# Patient Record
Sex: Male | Born: 1970 | Race: Black or African American | Hispanic: No | Marital: Single | Smoking: Current some day smoker
Health system: Southern US, Community
[De-identification: ages and names within clinical notes are randomized; demographics above are authoritative.]

## PROBLEM LIST (undated history)

## (undated) DIAGNOSIS — IMO0002 Reserved for concepts with insufficient information to code with codable children: Secondary | ICD-10-CM

## (undated) DIAGNOSIS — M329 Systemic lupus erythematosus, unspecified: Secondary | ICD-10-CM

## (undated) HISTORY — PX: HERNIA REPAIR: SHX51

## (undated) HISTORY — PX: HAND SURGERY: SHX662

---

## 2009-12-13 ENCOUNTER — Ambulatory Visit: Payer: Self-pay

## 2012-02-29 IMAGING — CR DG WRIST 2V*R*
1 series · 2 of 2 positions shown · non-contrast
Comparison: None

REASON FOR EXAM: carpel tunnel tendonitis problem with both hands
[id number]
COMMENTS:

PROCEDURE:     DXR - DXR WRIST RIGHT AP AND LATERAL  - December 13, 2009  [DATE]
RESULT:     History: Pain

[Series 1: view not recorded · 0.17mm/px · 2 of 2 slices shown]
[im 1/2]
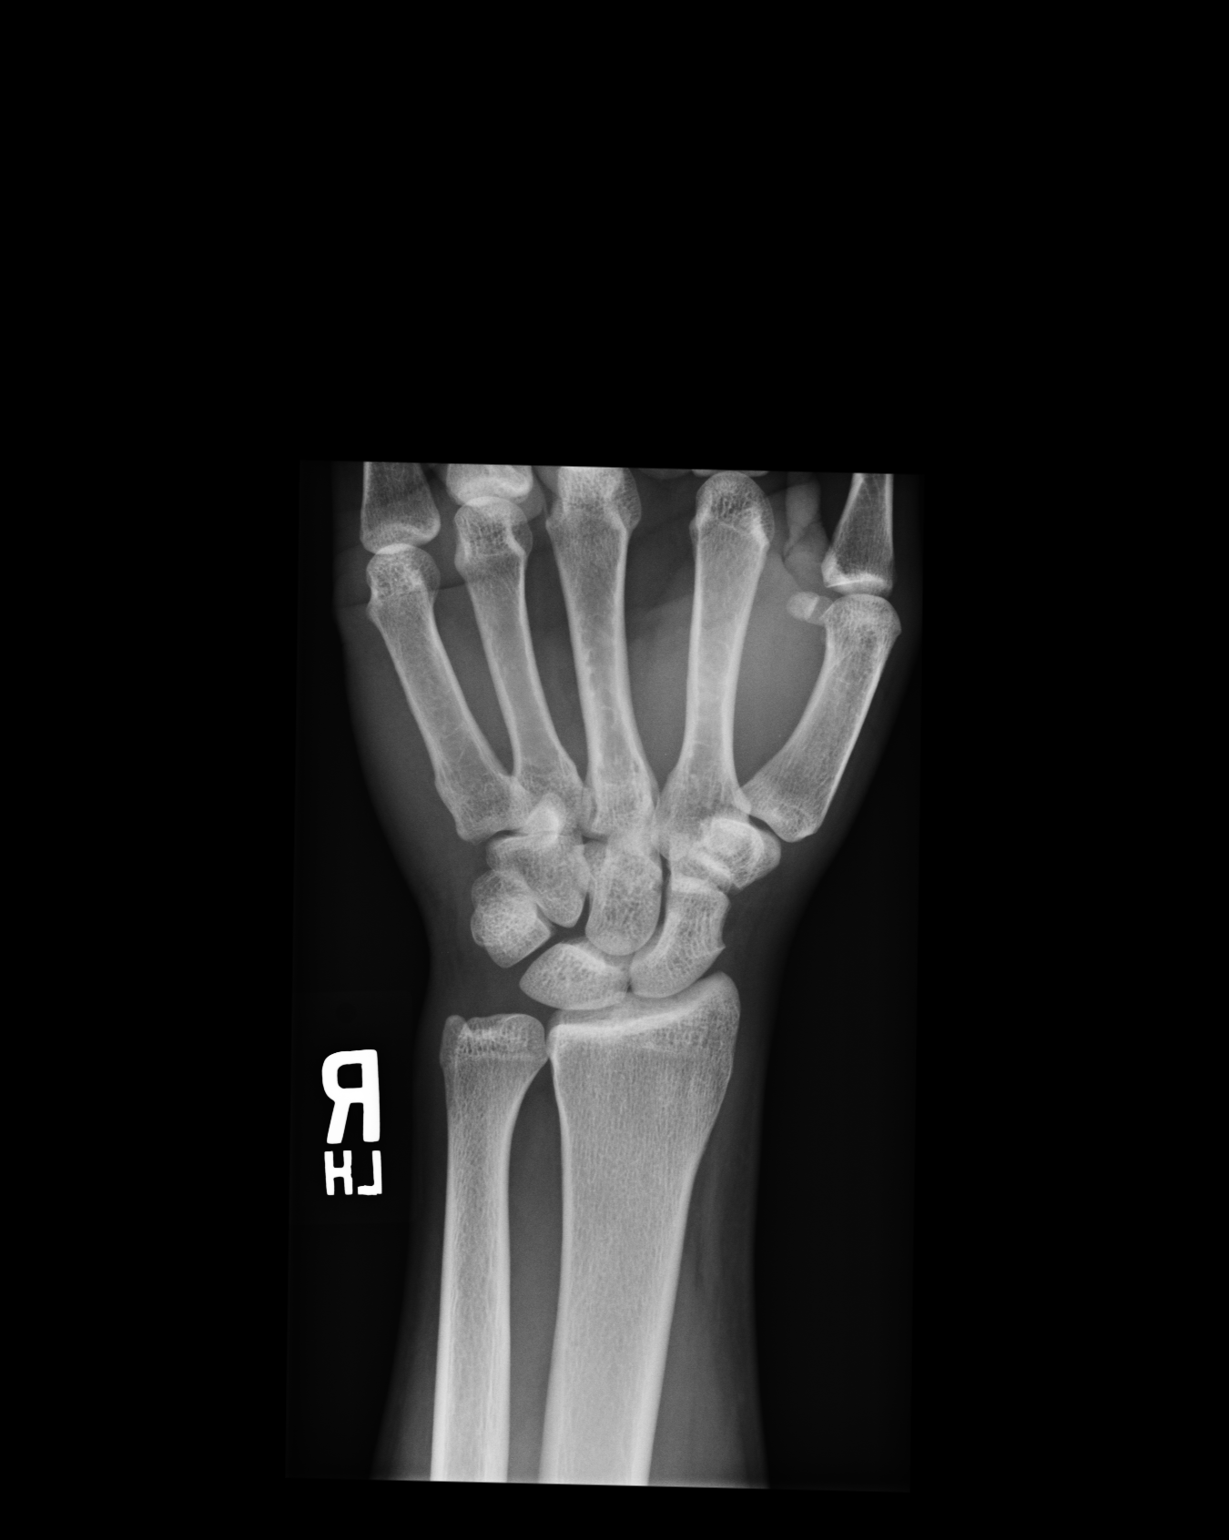
[im 2/2]
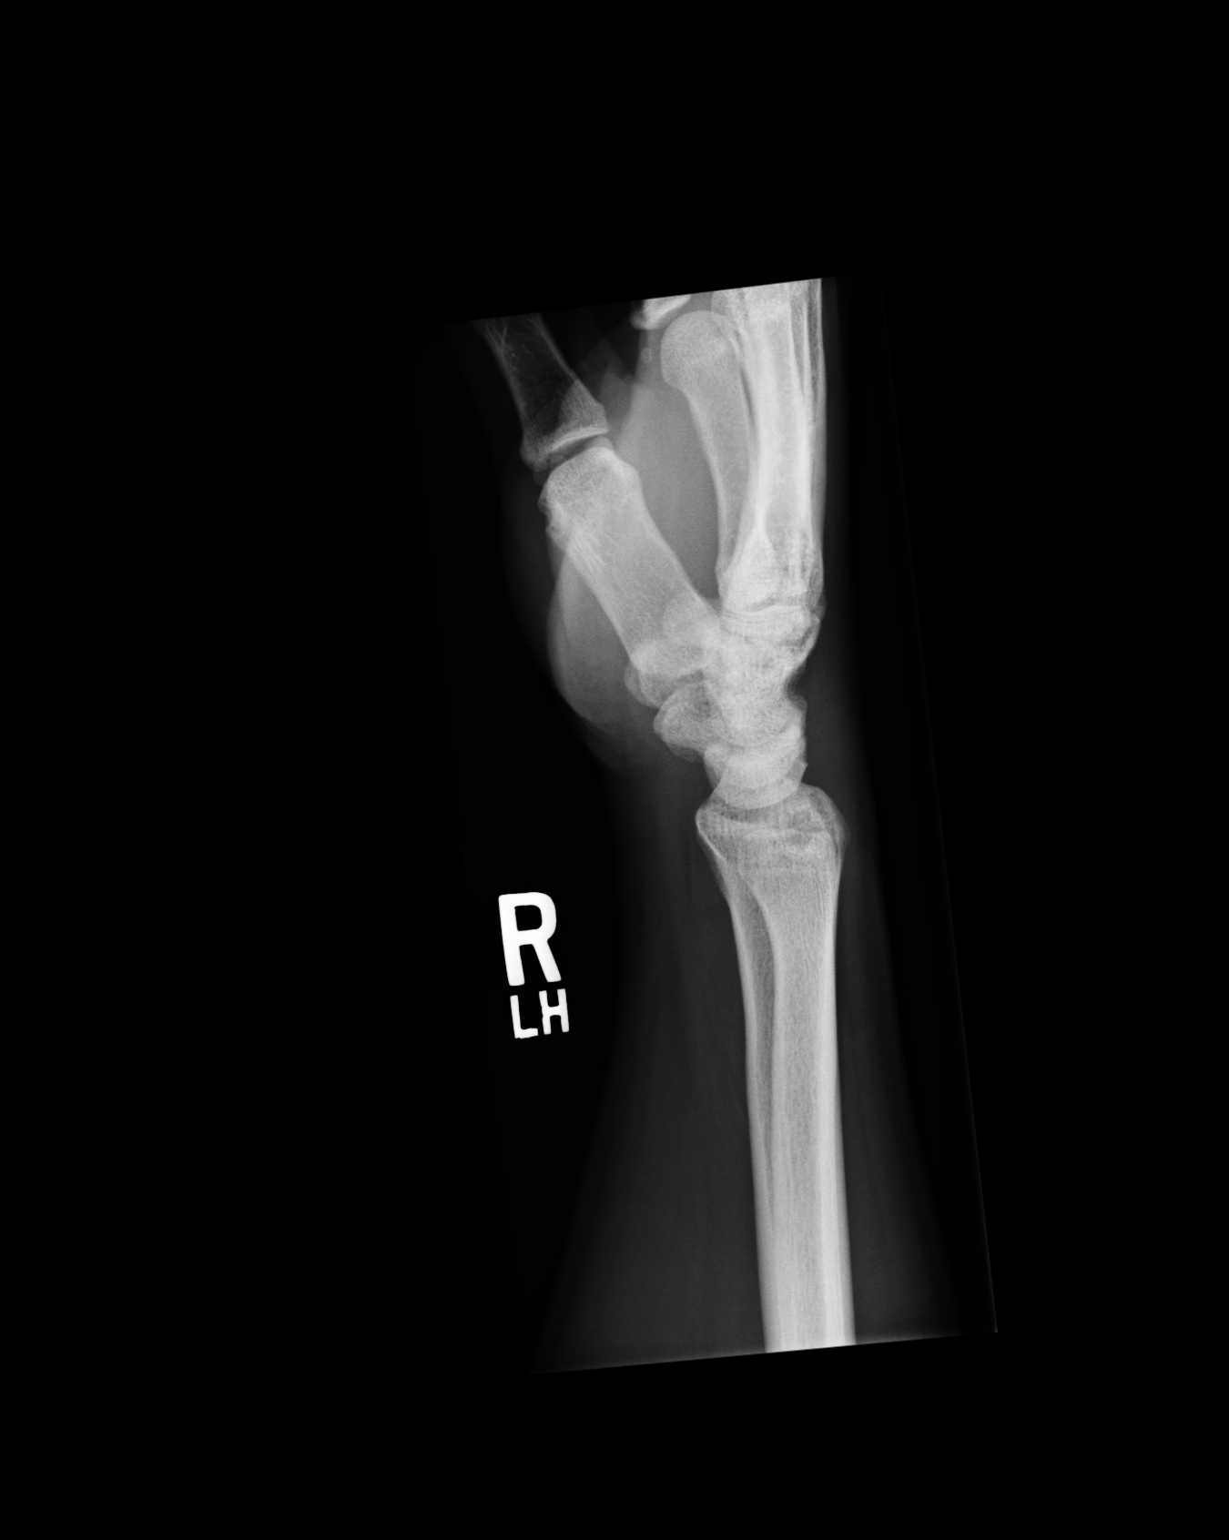

[2 of 2 positions shown; findings below may reference images not displayed]

FINDINGS: AP and lateral views of the right wrist demonstrates no acute fracture or
dislocation. The joint spaces are maintained. The soft tissues appear normal.
IMPRESSION: No acute osseous abnormality of the right wrist.

If there is clinical concern regarding tendinitis or carpal tunnel
pathology, further assessment with MRI is recommended.

## 2012-02-29 IMAGING — CR LEFT WRIST - 2 VIEW
1 series · 2 of 2 positions shown · non-contrast
Comparison: None

REASON FOR EXAM: carpel tunnel tendonitis problem with both hands
[id number]
COMMENTS:

PROCEDURE:     DXR - DXR WRIST LEFT AP AND LATERAL  - December 13, 2009  [DATE]
RESULT:     History: Pain

[Series 1: view not recorded · 0.17mm/px · 2 of 2 slices shown]
[im 1/2]
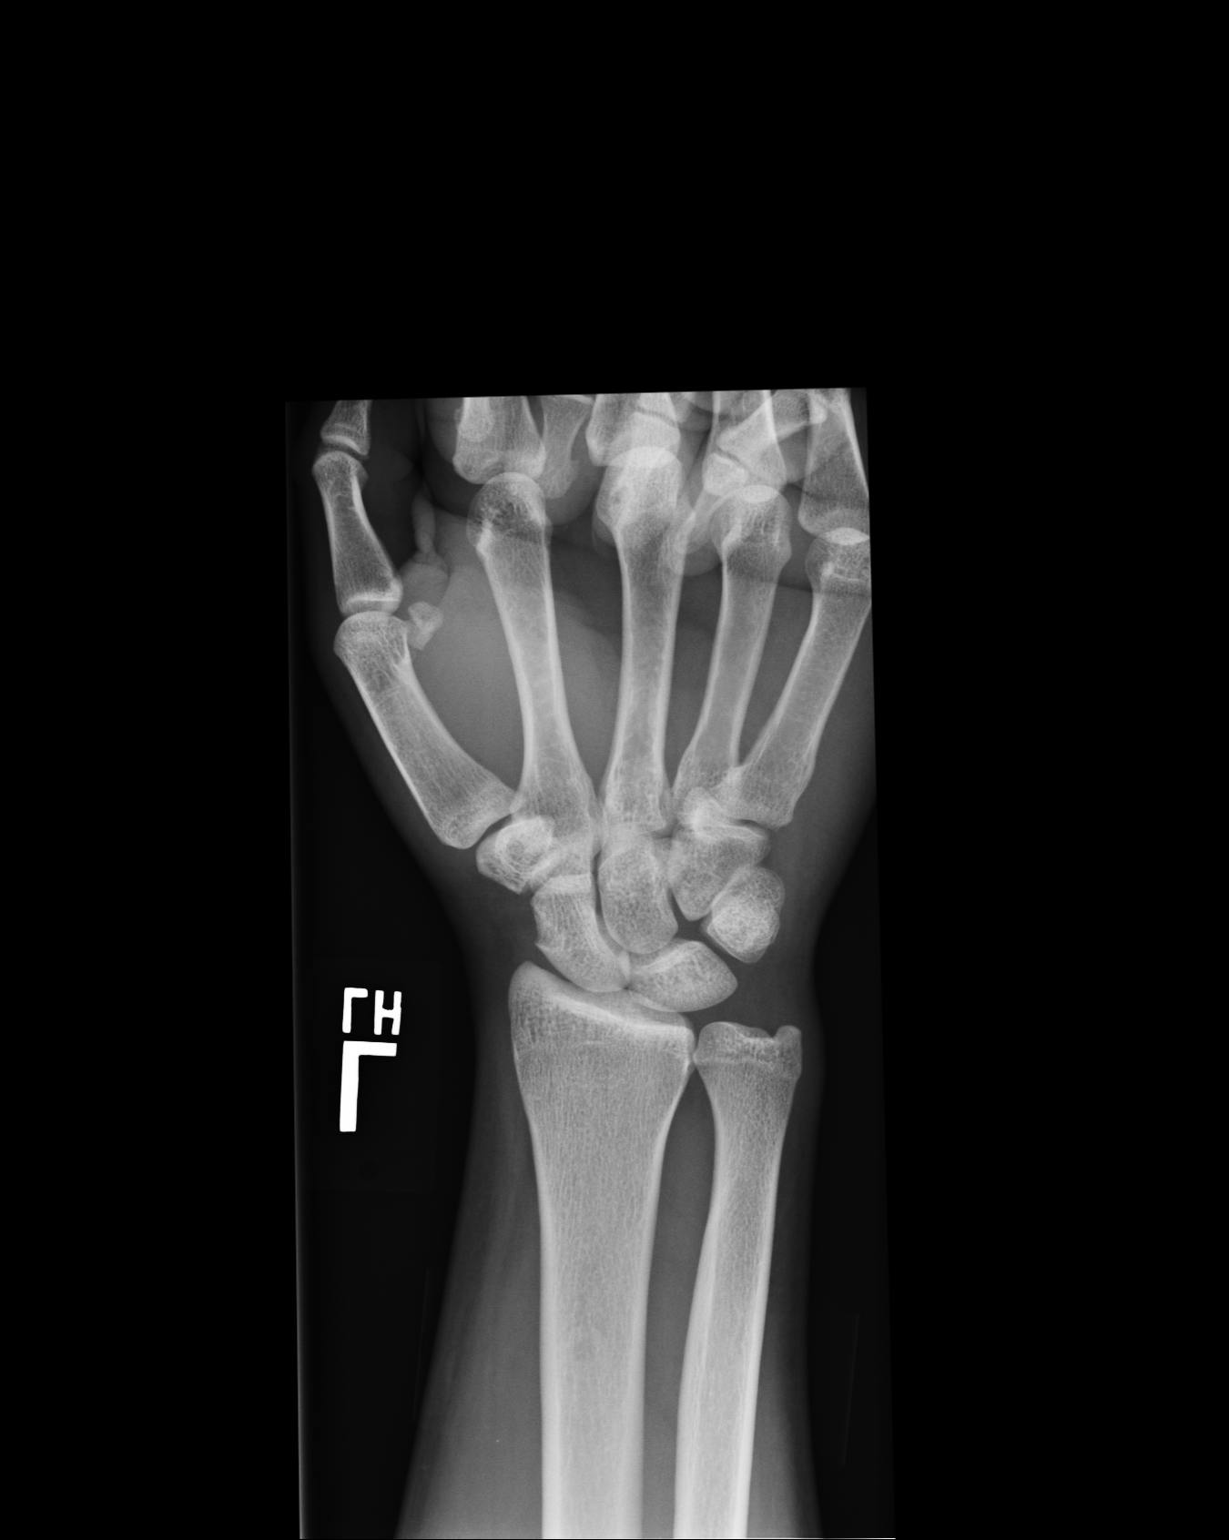
[im 2/2]
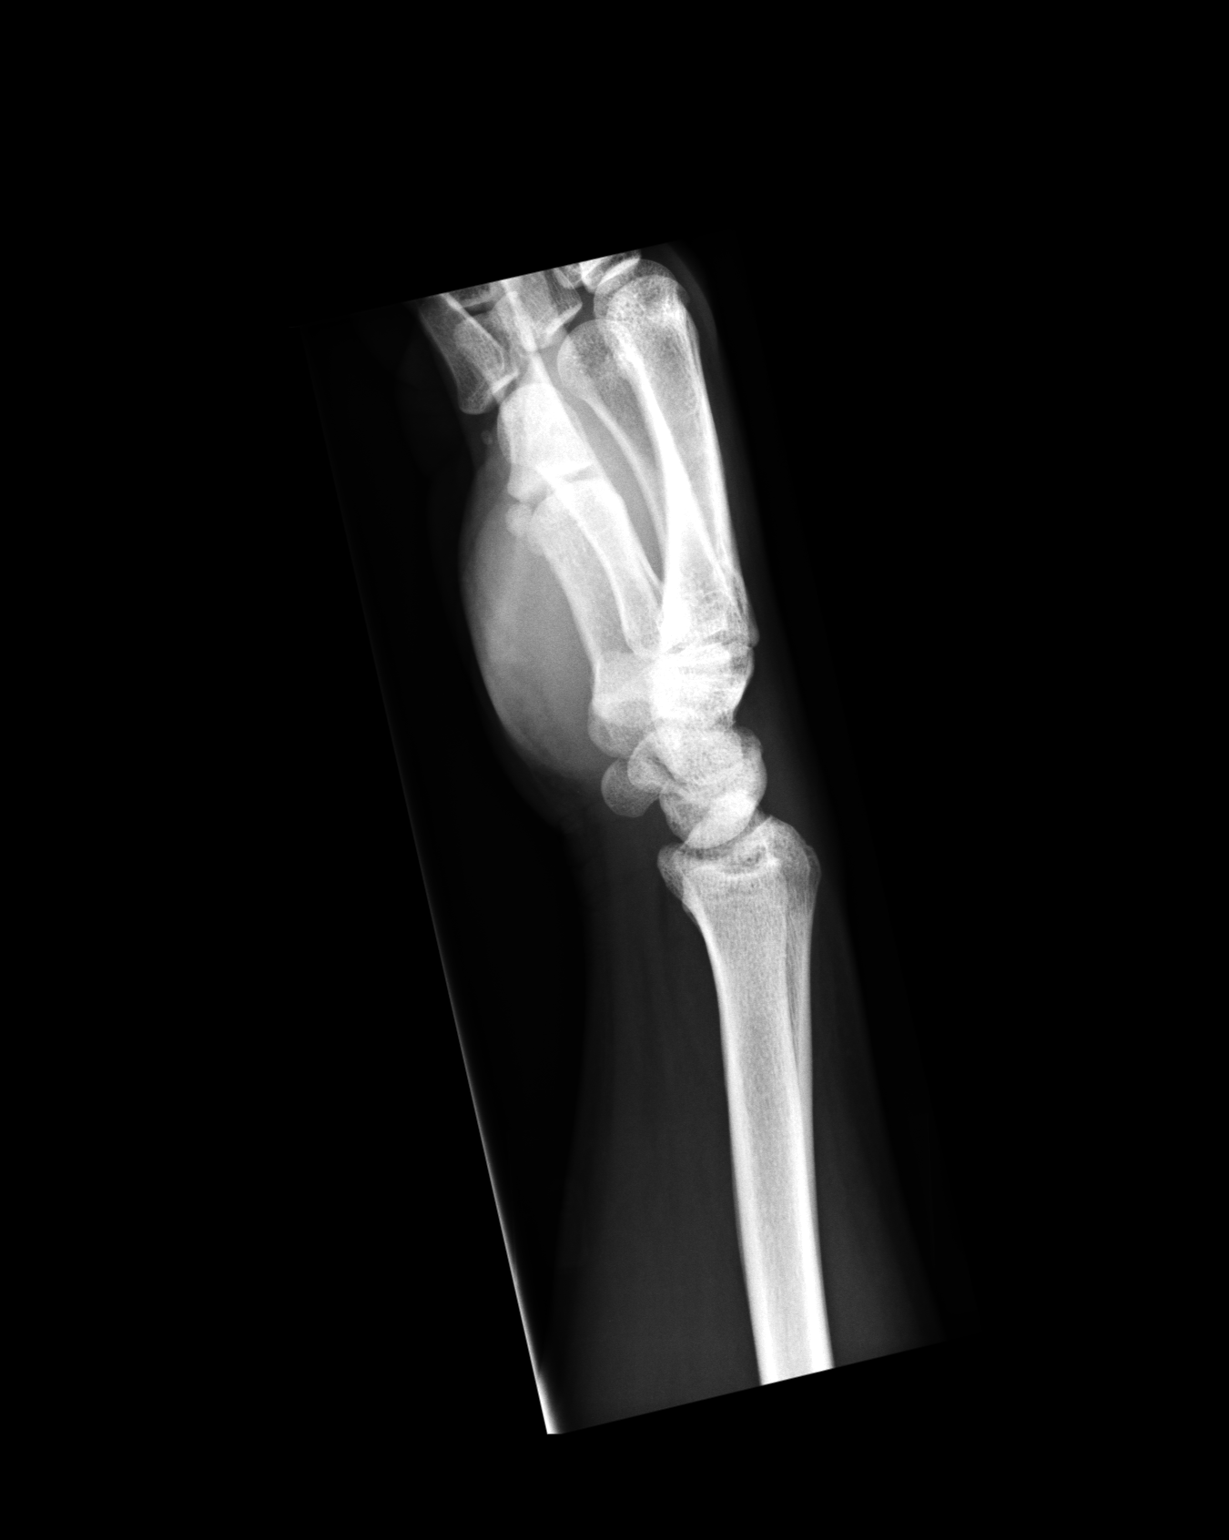

[2 of 2 positions shown; findings below may reference images not displayed]

FINDINGS: AP and lateral views of the left wrist demonstrates no acute fracture or
dislocation. The joint spaces are maintained. The soft tissues appear normal.
IMPRESSION: No acute osseous abnormality of the left wrist.

If there is clinical concern regarding tendinitis or carpal tunnel
pathology, further assessment with MRI is recommended.

## 2014-06-26 ENCOUNTER — Encounter: Payer: Self-pay | Admitting: Emergency Medicine

## 2014-06-26 ENCOUNTER — Emergency Department
Admission: EM | Admit: 2014-06-26 | Discharge: 2014-06-26 | Disposition: A | Discharge status: Home / Self Care | Payer: Self-pay | Attending: Emergency Medicine | Admitting: Emergency Medicine

## 2014-06-26 DIAGNOSIS — Z720 Tobacco use: Secondary | ICD-10-CM | POA: Insufficient documentation

## 2014-06-26 DIAGNOSIS — Z79899 Other long term (current) drug therapy: Secondary | ICD-10-CM | POA: Insufficient documentation

## 2014-06-26 DIAGNOSIS — K922 Gastrointestinal hemorrhage, unspecified: Secondary | ICD-10-CM | POA: Insufficient documentation

## 2014-06-26 HISTORY — DX: Systemic lupus erythematosus, unspecified: M32.9

## 2014-06-26 HISTORY — DX: Reserved for concepts with insufficient information to code with codable children: IMO0002

## 2014-06-26 LAB — CBC WITH DIFFERENTIAL/PLATELET
BASOS PCT: 1 %
Basophils Absolute: 0.1 10*3/uL (ref 0–0.1)
EOS ABS: 0.4 10*3/uL (ref 0–0.7)
Eosinophils Relative: 4 %
HCT: 48.3 % (ref 40.0–52.0)
Hemoglobin: 15.3 g/dL (ref 13.0–18.0)
LYMPHS PCT: 19 %
Lymphs Abs: 1.9 10*3/uL (ref 1.0–3.6)
MCH: 25.6 pg — AB (ref 26.0–34.0)
MCHC: 31.8 g/dL — ABNORMAL LOW (ref 32.0–36.0)
MCV: 80.7 fL (ref 80.0–100.0)
Monocytes Absolute: 0.5 10*3/uL (ref 0.2–1.0)
Monocytes Relative: 5 %
Neutro Abs: 7.4 10*3/uL — ABNORMAL HIGH (ref 1.4–6.5)
Neutrophils Relative %: 71 %
Platelets: 344 10*3/uL (ref 150–440)
RBC: 5.98 MIL/uL — AB (ref 4.40–5.90)
RDW: 15.9 % — ABNORMAL HIGH (ref 11.5–14.5)
WBC: 10.2 10*3/uL (ref 3.8–10.6)

## 2014-06-26 LAB — COMPREHENSIVE METABOLIC PANEL
ALBUMIN: 4 g/dL (ref 3.5–5.0)
ALK PHOS: 94 U/L (ref 38–126)
ALT: 24 U/L (ref 17–63)
ANION GAP: 9 (ref 5–15)
AST: 27 U/L (ref 15–41)
BUN: 12 mg/dL (ref 6–20)
CALCIUM: 9.2 mg/dL (ref 8.9–10.3)
CO2: 26 mmol/L (ref 22–32)
CREATININE: 1.2 mg/dL (ref 0.61–1.24)
Chloride: 100 mmol/L — ABNORMAL LOW (ref 101–111)
GFR calc non Af Amer: >60 mL/min (ref >60)
Glucose, Bld: 81 mg/dL (ref 65–99)
POTASSIUM: 3.9 mmol/L (ref 3.5–5.1)
Sodium: 135 mmol/L (ref 135–145)
TOTAL PROTEIN: 8.4 g/dL — AB (ref 6.5–8.1)
Total Bilirubin: 0.7 mg/dL (ref 0.3–1.2)

## 2014-06-26 LAB — TROPONIN I

## 2014-06-26 LAB — LIPASE, BLOOD: Lipase: 29 U/L (ref 22–51)

## 2014-06-26 MED ORDER — CALCIUM CARBONATE ANTACID 500 MG PO CHEW
1.0000 | CHEWABLE_TABLET | Freq: Once | ORAL | Status: AC
  Administered 2014-06-26: 200 mg via ORAL
  Filled 2014-06-26: qty 1

## 2014-06-26 MED ORDER — GI COCKTAIL ~~LOC~~
30.0000 mL | Freq: Once | ORAL | Status: AC
  Administered 2014-06-26: 30 mL via ORAL

## 2014-06-26 MED ORDER — ONDANSETRON 4 MG PO TBDP: 4.0000 mg | ORAL_TABLET | Freq: Once | ORAL | Status: AC

## 2014-06-26 MED ORDER — ONDANSETRON 4 MG PO TBDP: 4.0000 mg | ORAL_TABLET | Freq: Four times a day (QID) | ORAL | Status: AC | PRN

## 2014-06-26 MED ORDER — FAMOTIDINE 20 MG PO TABS: 20.0000 mg | ORAL_TABLET | Freq: Two times a day (BID) | ORAL | Status: DC

## 2014-06-26 MED ORDER — GI COCKTAIL ~~LOC~~
ORAL | Status: AC
  Administered 2014-06-26: 30 mL via ORAL
  Filled 2014-06-26: qty 30

## 2014-06-26 MED ORDER — FAMOTIDINE 20 MG PO TABS
40.0000 mg | ORAL_TABLET | Freq: Once | ORAL | Status: AC
  Administered 2014-06-26: 40 mg via ORAL
  Filled 2014-06-26: qty 2

## 2014-06-26 MED ORDER — ONDANSETRON HCL 4 MG PO TABS
ORAL_TABLET | ORAL | Status: AC
  Administered 2014-06-26: 4 mg
  Filled 2014-06-26: qty 1

## 2014-06-26 NOTE — ED Notes (Signed)
Patient also c/o intermittent left side chest pain while at rest over last week.  No SHOB.

## 2014-06-26 NOTE — ED Notes (Signed)
Patient had episode of blood in stool last Thursday and then has intermittently had dark blood in vomit since then (about 6 time per pt).  C/o upper abdominal pain as well.  Has been drinking few times per week since about 44 yrs old.  Cocaine use and marijuana use as well.

## 2014-06-26 NOTE — Discharge Instructions (Signed)
Gastrointestinal Bleeding  Please follow up with gastroenterology. Please start Pepcid 40 mg twice a day by mouth, in addition you may use Tums or Maalox for discomfort in the stomach. I have prescribed use Zofran for nausea.  Please do not use any alcohol. Please return to the ER right away if you have increased pain, increased bleeding, or unable to keep food down, blood in your stool again, a fever, or other new concerns arise.  Gastrointestinal bleeding is bleeding somewhere along the path that food travels through the body (digestive tract). This path is anywhere between the mouth and the opening of the butt (anus). You may have blood in your throw up (vomit) or in your poop (stools). If there is a lot of bleeding, you may need to stay in the hospital. HOME CARE  Only take medicine as told by your doctor.  Eat foods with fiber such as whole grains, fruits, and vegetables. You can also try eating 1 to 3 prunes a day.  Drink enough fluids to keep your pee (urine) clear or pale yellow. GET HELP RIGHT AWAY IF:   Your bleeding gets worse.  You feel dizzy, weak, or you pass out (faint).  You have bad cramps in your back or belly (abdomen).  You have large blood clumps (clots) in your poop.  Your problems are getting worse. MAKE SURE YOU:   Understand these instructions.  Will watch your condition.  Will get help right away if you are not doing well or get worse. Document Released: 11/06/2007 Document Revised: 01/14/2012 Document Reviewed: 01/06/2011 [facility] Patient Information 2015 [place], [place]. This information is not intended to replace advice given to you by your health care provider. Make sure you discuss any questions you have with your health care provider.

## 2014-06-26 NOTE — ED Provider Notes (Signed)
[facility] Emergency Department Provider Note  ____________________________________________  Time seen: Approximately 3:38 PM  I have reviewed the triage vital signs and the nursing notes.   HISTORY  Chief Complaint Blood in vomit   HPI Brian Stephenson is a 44 y.o. male  who reports having a small amount of blood in his stool on Thursday. Then over the last 2 days he says he's been having ongoing upset stomach and has occasionally had small amounts of blood in his vomit. He was able to tolerate fluids and eating. He says he has not seen any more blood in his stool for the last 2 days. He has a very long history over the last 2 years of frequent acid reflux and pain in his left upper abdomen. He believes he may have stomach ulcers but has never seen a GI physician. He does use alcohol occasionally up to a few times a week, sometimes heavily, but not daily.   He came in for evaluation today because his pain is ongoing. He states that he has tried medications like Prilosec and Nexium but cannot tolerate them. He has used Zantac previously, but states it didn't help much.  He states he has an ongoing aching feeling in the left upper abdomen. No fevers no chills. No severe abdominal pain. No rash. He denies "chest pain" but states it feels more like his stomach hurts up into the left side of his chest under the ribs. There is no radiating chest pain. No shortness of breath.   Past Medical History  Diagnosis Date  . Lupus    when asked, patient does not recall any history of lupus  There are no active problems to display for this patient.   Past Surgical History  Procedure Laterality Date  . Hernia repair    . Hand surgery      Current Outpatient Rx  Name  Route  Sig  Dispense  Refill  . famotidine (PEPCID) 20 MG tablet   Oral   Take 1 tablet (20 mg total) by mouth 2 (two) times daily.   60 tablet   1   . ondansetron (ZOFRAN ODT) 4 MG disintegrating  tablet   Oral   Take 1 tablet (4 mg total) by mouth every 6 (six) hours as needed for nausea or vomiting.   20 tablet   0     Allergies Review of patient's allergies indicates no known allergies.  History reviewed. No pertinent family history.  Social History History  Substance Use Topics  . Smoking status: Current Some Day Smoker  . Smokeless tobacco: Not on file  . Alcohol Use: Yes     Comment: couple times per week    Review of Systems Constitutional: No fever/chills Eyes: No visual changes. ENT: No sore throat. Cardiovascular: Denies chest pain. Respiratory: Denies shortness of breath. Gastrointestinal: See history of present illness No diarrhea.  No constipation. Genitourinary: Negative for dysuria. Musculoskeletal: Negative for back pain. Skin: Negative for rash. Neurological: Negative for headaches, focal weakness or numbness.  10-point ROS otherwise negative.  ____________________________________________   PHYSICAL EXAM:  VITAL SIGNS: ED Triage Vitals  Enc Vitals Group     BP 06/26/14 1154 120/73 mmHg     Pulse Rate 06/26/14 1154 96     Resp 06/26/14 1154 18     Temp 06/26/14 1154 98.2 F (36.8 C)     Temp Source 06/26/14 1154 Oral     SpO2 06/26/14 1154 100 %  Weight 06/26/14 1154 227 lb (102.967 kg)     Height 06/26/14 1154 6' (1.829 m)     Head Cir --      Peak Flow --      Pain Score 06/26/14 1209 7     Pain Loc --      Pain Edu? --      Excl. in GC? --     Constitutional: Alert and oriented. Well appearing and in no acute distress. Eyes: Conjunctivae are normal. PERRL. EOMI. Head: Atraumatic. Nose: No congestion/rhinnorhea. Mouth/Throat: Mucous membranes are moist.  Oropharynx non-erythematous. Neck: No stridor.   Cardiovascular: Normal rate, regular rhythm. Grossly normal heart sounds.  Good peripheral circulation. Respiratory: Normal respiratory effort.  No retractions. Lungs CTAB. Gastrointestinal: Soft and nontender except for  mild "ache" in the left upper quadrant.. No distention. No abdominal bruits. No CVA tenderness. No peritoneal signs. No evidence of acute abdomen. Rectal exam is negative for blood. Control normal. Musculoskeletal: No lower extremity tenderness nor edema.  No joint effusions. Neurologic:  Normal speech and language. No gross focal neurologic deficits are appreciated. Speech is normal. No gait instability. Skin:  Skin is warm, dry and intact. No rash noted. Psychiatric: Mood and affect are normal. Speech and behavior are normal.  ____________________________________________   LABS (all labs ordered are listed, but only abnormal results are displayed)  Labs Reviewed  CBC WITH DIFFERENTIAL/PLATELET - Abnormal; Notable for the following:    RBC 5.98 (*)    MCH 25.6 (*)    MCHC 31.8 (*)    RDW 15.9 (*)    Neutro Abs 7.4 (*)    All other components within normal limits  COMPREHENSIVE METABOLIC PANEL - Abnormal; Notable for the following:    Chloride 100 (*)    Total Protein 8.4 (*)    All other components within normal limits  LIPASE, BLOOD  TROPONIN I   ____________________________________________  EKG   Date: 06/26/2014  Rate: 88  Rhythm: normal sinus rhythm  QRS Axis: normal  Intervals: normal  ST/T Wave abnormalities: normal  Conduction Disutrbances: none  Narrative Interpretation: unremarkable except for septal Q-wave in V2, there are no acute ischemic changes.     ____________________________________________  RADIOLOGY   ____________________________________________   PROCEDURES  Procedure(s) performed: None  Critical Care performed: No  ____________________________________________   INITIAL IMPRESSION / ASSESSMENT AND PLAN / ED COURSE  Pertinent labs & imaging results that were available during my care of the patient were reviewed by me and considered in my medical decision making (see chart for details).  Patient presents with approximately 2 months  of left upper quadrant abdominal pain, has a long history of similar over the last 2 years. Over the last few days he has noticed blood in his stool and does report having vomited a few times with some blood in it. At present he is hemodynamically stable and has a normal hemoglobin. He has mild tenderness in the left upper quadrant. He has no evidence of ischemic change. I believe the patient is having symptoms likely of some type of peptic ulcer ordered duodenal ulcer versus gastritis. Fortunately, his guaiac is negative in the stool. He has no evidence of vomiting in the ER. He is tolerating fluids now in the ER.  I discussed with Dr. wall of gastroenterology, we will place the patient on Pepcid twice a day, he'll use over-the-counter Tums, and prescription Zofran. He will follow-up in the clinic. The patient is again hemodynamically stable, he has no evidence  of active ongoing bleeding in the ER. I believe he is most appropriate for outpatient follow-up at this point. Because he is intolerant of PPIs, we will not be able to initiate that rather we will use H2 blockers.  I discussed with the patient importance of return precautions including any increased bleeding, weakness, lightheadedness, increased abdominal pain, blood in his stool returning, inability to hold fluid down or other new concerns arise. Patient understands the importance of follow-up with GI physician as he could have a stomach ulcer, or other causes like stomach bleeding, stomach mass, or cancer which could be causing his persistent symptoms.    ----------------------------------------- 4:33 PM on 06/26/2014 -----------------------------------------  Patient medicated, feels improvement after medications, and is tolerating by mouth well.   FINAL CLINICAL IMPRESSION(S) / ED DIAGNOSES  Final diagnoses:  Upper GI bleeding      Sharyn CreamerMark Quale, MD 06/26/14 [phone]

## 2014-09-01 ENCOUNTER — Other Ambulatory Visit: Payer: Self-pay

## 2014-09-01 DIAGNOSIS — K920 Hematemesis: Secondary | ICD-10-CM

## 2014-09-01 MED ORDER — OMEPRAZOLE 20 MG PO CPDR: 20.0000 mg | DELAYED_RELEASE_CAPSULE | Freq: Every day | ORAL | Status: AC
